# Patient Record
Sex: Male | Born: 1980 | Race: White | Hispanic: No | Marital: Married | Smoking: Current every day smoker
Health system: Southern US, Community
[De-identification: ages and names within clinical notes are randomized; demographics above are authoritative.]

## PROBLEM LIST (undated history)

## (undated) DIAGNOSIS — E7800 Pure hypercholesterolemia, unspecified: Secondary | ICD-10-CM

## (undated) HISTORY — PX: UMBILICAL HERNIA REPAIR: SHX196

---

## 2009-04-24 ENCOUNTER — Emergency Department: Payer: Self-pay | Admitting: Emergency Medicine

## 2014-01-02 ENCOUNTER — Ambulatory Visit: Payer: Self-pay | Admitting: Surgery

## 2014-01-02 LAB — BASIC METABOLIC PANEL
ANION GAP: 3 — AB (ref 7–16)
BUN: 13 mg/dL (ref 7–18)
CHLORIDE: 108 mmol/L — AB (ref 98–107)
Calcium, Total: 9.5 mg/dL (ref 8.5–10.1)
Co2: 28 mmol/L (ref 21–32)
Creatinine: 1 mg/dL (ref 0.60–1.30)
EGFR (African American): >60
EGFR (Non-African Amer.): >60
GLUCOSE: 90 mg/dL (ref 65–99)
Osmolality: 277 (ref 275–301)
POTASSIUM: 4.3 mmol/L (ref 3.5–5.1)
Sodium: 139 mmol/L (ref 136–145)

## 2014-01-07 ENCOUNTER — Ambulatory Visit: Payer: Self-pay | Admitting: Surgery

## 2015-03-15 NOTE — Op Note (Signed)
PATIENT NAME:  Jeremy Cook, Jeremy Cook MR#:  [id number] DATE OF BIRTH:  Jul 16, 1981  DATE OF PROCEDURE:  01/07/2014  PREOPERATIVE DIAGNOSIS: Umbilical hernia.   POSTOPERATIVE DIAGNOSIS: Umbilical hernia.    PROCEDURE PERFORMED: Open primary repair of umbilical hernia.   SURGEON: Shaelin Lalley A. Karon Cotterill, MD  ESTIMATED BLOOD LOSS: 5 mL.   COMPLICATIONS: None.   SPECIMENS: None.   INDICATION FOR SURGERY: Jeremy Cook is a pleasant 34 year old male with history of recurrent belly umbilical pain. He is known to have a small hernia. He is thus brought into the operating room suite for umbilical hernia repair.   DETAILS OF PROCEDURE: As follows: Informed consent was obtained. Jeremy Cook was brought to the operating room suite and laid supine on the operating room table. He was induced. Endotracheal tube was placed, general anesthesia was administered. His abdomen was then prepped and draped in a standard surgical fashion. A timeout was then performed, correctly identifying the patient name, operative report and procedure to be performed. An infraumbilical incision was made and was deepened down to the fascia. The umbilicus was encircled with a Kelly clamp. The umbilicus was then taken off the fascial defect. The defect measured approximately 0.7 x 0.7 cm. A decision was made not to place mesh at this time as this was a fairly small hernia. I then used interrupted 0 Ethibond sutures to close the wound transversely. The wound was then made hemostatic. The umbilicus was then reattached to the fascial repair with an interrupted 3-0 Vicryl. The skin was then closed in 2 layers of 3-0 Vicryl deep dermal and a 4-0 Monocryl subcuticular. Steri-Strips, Telfa gauze and Tegaderm were then placed over the wound. The patient was then awoken, extubated and brought to the postanesthesia care unit. There were no immediate complications. Needle, sponge and instrument count was correct at the end of the procedure.    ____________________________ Si Raiderhristopher A. Zak Gondek, MD cal:lb D: 01/08/2014 15:21:00 ET T: 01/08/2014 15:35:34 ET JOB#: [id number]  cc: Cristal Deerhristopher A. Braylei Totino, MD, <Dictator> Jarvis NewcomerHRISTOPHER A Allan Bacigalupi MD ELECTRONICALLY SIGNED 01/10/2014 9:35

## 2015-05-12 ENCOUNTER — Ambulatory Visit
Admission: EM | Admit: 2015-05-12 | Discharge: 2015-05-12 | Disposition: A | Discharge status: Home / Self Care | Payer: PRIVATE HEALTH INSURANCE | Attending: Family Medicine | Admitting: Family Medicine

## 2015-05-12 DIAGNOSIS — R51 Headache: Secondary | ICD-10-CM | POA: Diagnosis not present

## 2015-05-12 DIAGNOSIS — R519 Headache, unspecified: Secondary | ICD-10-CM

## 2015-05-12 DIAGNOSIS — R1110 Vomiting, unspecified: Secondary | ICD-10-CM

## 2015-05-12 MED ORDER — ONDANSETRON 8 MG PO TBDP
8.0000 mg | ORAL_TABLET | Freq: Once | ORAL | Status: AC
  Administered 2015-05-12: 8 mg via ORAL

## 2015-05-12 NOTE — ED Notes (Signed)
Intermittent headaches several times daily for the last week. This morning at 1000 hrs. Developed "the worst headache of my life". + nausea and vomited x 2. Denies photophobia

## 2015-05-12 NOTE — ED Provider Notes (Signed)
CSN: [id number]     Arrival date & time 05/12/15  1801 History   First MD Initiated Contact with Patient 05/12/15 1820     Chief Complaint  Patient presents with  . Headache   (Consider location/radiation/quality/duration/timing/severity/associated sxs/prior Treatment) HPI Comments: 34 yo male presents with a h/o sudden onset of severe headache all around since 10am. States it's the worst headache he's ever had. Also complains of vomiting x2 since then. Denies photophobia, fevers, chills, vision changes, numbness/tingling, one-sided weakness, injury.   Patient is a 34 y.o. male presenting with headaches. The history is provided by the patient.  Headache Pain location:  Generalized Quality:  Unable to specify Radiates to:  Does not radiate Severity currently:  10/10 Onset quality:  Sudden Duration:  8 hours Timing:  Constant Progression:  Unchanged Chronicity:  New Similar to prior headaches: no   Context: not activity, not exposure to bright light, not caffeine, not coughing, not defecating, not eating, not stress, not exposure to cold air, not intercourse, not loud noise and not straining   Relieved by:  Nothing   History reviewed. No pertinent past medical history. Past Surgical History  Procedure Laterality Date  . Umbilical hernia repair     Family History  Problem Relation Age of Onset  . Hypertension Mother   . Hypertension Father    History  Substance Use Topics  . Smoking status: Current Every Day Smoker -- 1.00 packs/day    Types: Cigarettes  . Smokeless tobacco: Not on file  . Alcohol Use: Yes     Comment: socially    Review of Systems  Neurological: Positive for headaches.    Allergies  Review of patient's allergies indicates no known allergies.  Home Medications   Prior to Admission medications   Medication Sig Start Date End Date Taking? Authorizing Provider  aspirin-acetaminophen-caffeine (EXCEDRIN MIGRAINE) [phone] MG per tablet Take by mouth  every 6 (six) hours as needed for headache.   Yes Historical Provider, MD  ibuprofen (ADVIL,MOTRIN) 800 MG tablet Take 800 mg by mouth every 8 (eight) hours as needed.   Yes Historical Provider, MD   BP 134/96 mmHg  Pulse 90  Temp(Src) 97.4 F (36.3 C) (Tympanic)  Resp 16  Ht  (1.676 m)  Wt 190 lb (86.183 kg)  BMI 30.68 kg/m2  SpO2 99% Physical Exam  Constitutional: He is oriented to person, place, and time. He appears well-developed and well-nourished. No distress.  HENT:  Head: Normocephalic and atraumatic.  Eyes: EOM are normal. Pupils are equal, round, and reactive to light.  Neck: Normal range of motion. Neck supple.  Neurological: He is alert and oriented to person, place, and time. He has normal reflexes. He displays normal reflexes. No cranial nerve deficit. He exhibits normal muscle tone. Coordination normal.  Skin: No rash noted. He is not diaphoretic.  Psychiatric: Thought content normal.    ED Course  Procedures (including critical care time) Labs Review Labs Reviewed - No data to display  Imaging Review No results found.   MDM   1. Acute intractable headache, unspecified headache type   2. Vomiting in adult   (severe headache; acute onset; non-focal neurologic exam)  Plan: 1. diagnosis reviewed with patient; concern with this being severe ("worst headache"), recommend patient go to hospital ED for further evaluation and management; possible CT scan of head. Patient verbalizes understanding and states will proceed by private vehicle with girlfriend driving to hospital ED. Patient in stable condition.  2. Patient given   po zofran in clinic for nausea/vomiting.  Payton Mccallum, MD 05/12/15 1905

## 2015-05-12 NOTE — ED Notes (Signed)
Family at bedside. Dr. Judd Gaudier at bedside to examine patient. Requires further evaluation. [facility] ER notified that wife taking patient POV.

## 2016-02-05 ENCOUNTER — Encounter: Payer: Self-pay | Admitting: *Deleted

## 2016-02-05 ENCOUNTER — Ambulatory Visit
Admission: EM | Admit: 2016-02-05 | Discharge: 2016-02-05 | Disposition: A | Discharge status: Home / Self Care | Payer: PRIVATE HEALTH INSURANCE | Attending: Family Medicine | Admitting: Family Medicine

## 2016-02-05 DIAGNOSIS — N410 Acute prostatitis: Secondary | ICD-10-CM

## 2016-02-05 DIAGNOSIS — J111 Influenza due to unidentified influenza virus with other respiratory manifestations: Secondary | ICD-10-CM | POA: Diagnosis not present

## 2016-02-05 LAB — CHLAMYDIA/NGC RT PCR (ARMC ONLY)
CHLAMYDIA TR: NOT DETECTED
N gonorrhoeae: NOT DETECTED

## 2016-02-05 LAB — URINALYSIS COMPLETE WITH MICROSCOPIC (ARMC ONLY)
BILIRUBIN URINE: NEGATIVE
Glucose, UA: NEGATIVE mg/dL
Ketones, ur: NEGATIVE mg/dL
NITRITE: POSITIVE — AB
PH: 6 (ref 5.0–8.0)
Protein, ur: 30 mg/dL — AB
Specific Gravity, Urine: 1.025 (ref 1.005–1.030)
Squamous Epithelial / LPF: NONE SEEN

## 2016-02-05 LAB — OCCULT BLOOD X 1 CARD TO LAB, STOOL: Fecal Occult Bld: NEGATIVE

## 2016-02-05 LAB — RAPID INFLUENZA A&B ANTIGENS
Influenza A (ARMC): NEGATIVE
Influenza B (ARMC): NEGATIVE

## 2016-02-05 MED ORDER — CIPROFLOXACIN HCL 500 MG PO TABS: 500.0000 mg | ORAL_TABLET | Freq: Two times a day (BID) | ORAL | Status: DC

## 2016-02-05 MED ORDER — ACETAMINOPHEN 500 MG PO TABS
1000.0000 mg | ORAL_TABLET | Freq: Once | ORAL | Status: AC
  Administered 2016-02-05: 1000 mg via ORAL

## 2016-02-05 MED ORDER — ONDANSETRON 8 MG PO TBDP
8.0000 mg | ORAL_TABLET | Freq: Once | ORAL | Status: AC
  Administered 2016-02-05: 8 mg via ORAL

## 2016-02-05 MED ORDER — OSELTAMIVIR PHOSPHATE 75 MG PO CAPS: 75.0000 mg | ORAL_CAPSULE | Freq: Two times a day (BID) | ORAL | Status: DC

## 2016-02-05 NOTE — Discharge Instructions (Signed)

## 2016-02-05 NOTE — ED Provider Notes (Signed)
CSN: [id number]     Arrival date & time 02/05/16  1341 History   First MD Initiated Contact with Patient 02/05/16 1531     Chief Complaint  Patient presents with  . Fever  . Generalized Body Aches  . Abdominal Pain  . Nausea  . Dysuria   (Consider location/radiation/quality/duration/timing/severity/associated sxs/prior Treatment) HPI   This a 35 year old male who presents with general body aches fever nausea dysuria and now with dry heaves and some constipation. He states this was a sudden onset yesterday it seemed worse today B. His main complaint is that of generalized body aches. He states that at times it is difficult to initiate urination and will on occasion be uncomfortable but is certainly not an ongoing instant problem. He did not have a flu shot this year vital signs include a temperature 99.5 pulse 63 respirations 16 blood pressure 129/80 room air sat is 100%        History reviewed. No pertinent past medical history. Past Surgical History  Procedure Laterality Date  . Umbilical hernia repair     Family History  Problem Relation Age of Onset  . Hypertension Mother   . Hypertension Father    Social History  Substance Use Topics  . Smoking status: Current Every Day Smoker -- 1.00 packs/day    Types: Cigarettes  . Smokeless tobacco: None  . Alcohol Use: Yes     Comment: socially    Review of Systems  Constitutional: Positive for fever, activity change, appetite change and fatigue. Negative for chills.  HENT: Positive for congestion, postnasal drip, rhinorrhea and sinus pressure.   Respiratory: Negative for cough, shortness of breath, wheezing and stridor.   Gastrointestinal: Positive for nausea, vomiting and constipation.  Genitourinary: Positive for difficulty urinating.  All other systems reviewed and are negative.   Allergies  Review of patient's allergies indicates no known allergies.  Home Medications   Prior to Admission medications   Medication  Sig Start Date End Date Taking? Authorizing Provider  acetaminophen (TYLENOL) 325 MG tablet Take 650 mg by mouth every 6 (six) hours as needed.   Yes Historical Provider, MD  ibuprofen (ADVIL,MOTRIN) 800 MG tablet Take 800 mg by mouth every 8 (eight) hours as needed.   Yes Historical Provider, MD  aspirin-acetaminophen-caffeine (EXCEDRIN MIGRAINE) [phone] MG per tablet Take by mouth every 6 (six) hours as needed for headache.    Historical Provider, MD  ciprofloxacin (CIPRO) 500 MG tablet Take 1 tablet (500 mg total) by mouth 2 (two) times daily. 02/05/16   Lutricia Feil, PA-C  oseltamivir (TAMIFLU) 75 MG capsule Take 1 capsule (75 mg total) by mouth every 12 (twelve) hours. 02/05/16   Lutricia Feil, PA-C   Meds Ordered and Administered this Visit   Medications  acetaminophen (TYLENOL) tablet 1,000 mg (1,000 mg Oral Given 02/05/16 1526)  ondansetron (ZOFRAN-ODT) disintegrating tablet 8 mg (8 mg Oral Given 02/05/16 1527)    BP 129/80 mmHg  Pulse 63  Temp(Src) 99.5 F (37.5 C) (Oral)  Resp 16  Ht  (1.676 m)  Wt 190 lb (86.183 kg)  BMI 30.68 kg/m2  SpO2 100% No data found.   Physical Exam  Constitutional: He is oriented to person, place, and time. He appears well-developed and well-nourished. No distress.  HENT:  Head: Normocephalic and atraumatic.  Right Ear: External ear normal.  Left TM is erythematous with retraction  Eyes: Conjunctivae and EOM are normal. Pupils are equal, round, and reactive to light.  Neck: Normal  range of motion. Neck supple.  Pulmonary/Chest: Effort normal and breath sounds normal. No respiratory distress. He has no wheezes. He has no rales.  Genitourinary: No penile tenderness.  Rectal exam was performed he has large external hemorrhoids present. The prostate felt enlarged but not hard is boggy but tender. Is very minimal amount of stool present. Is placed on a Hemoccult card and submitted to the laboratory  Musculoskeletal: Normal range of  motion. He exhibits no edema or tenderness.  Lymphadenopathy:    He has no cervical adenopathy.  Neurological: He is alert and oriented to person, place, and time.  Skin: Skin is warm and dry. He is not diaphoretic.  Psychiatric: He has a normal mood and affect. His behavior is normal. Judgment and thought content normal.  Nursing note and vitals reviewed.   ED Course  Procedures (including critical care time)  Labs Review Labs Reviewed  URINALYSIS COMPLETEWITH MICROSCOPIC ([facility] ONLY) - Abnormal; Notable for the following:    APPearance HAZY (*)    Hgb urine dipstick 1+ (*)    Protein, ur 30 (*)    Nitrite POSITIVE (*)    Leukocytes, UA TRACE (*)    Bacteria, UA MANY (*)    All other components within normal limits  RAPID INFLUENZA A&B ANTIGENS ([facility] ONLY)  CHLAMYDIA/NGC RT PCR ([facility] ONLY)    Imaging Review No results found.   Visual Acuity Review  Right Eye Distance:   Left Eye Distance:   Bilateral Distance:    Right Eye Near:   Left Eye Near:    Bilateral Near:     15:26 Medication Given KF  acetaminophen (TYLENOL) tablet 1,000 mg - Dose: 1,000 mg ; Route: Oral ; Scheduled Time: 1530     15:27 Medication Given KF  ondansetron (ZOFRAN-ODT) disintegrating tablet 8 mg - Dose: 8 mg ; Route: Oral ; Scheduled Time: 1530             MDM   1. Prostatitis, acute   2. Influenza      Discharge Medication List as of 02/05/2016  4:29 PM    START taking these medications   Details  ciprofloxacin (CIPRO) 500 MG tablet Take 1 tablet (500 mg total) by mouth 2 (two) times daily., Starting 02/05/2016, Until Discontinued, Normal    oseltamivir (TAMIFLU) 75 MG capsule Take 1 capsule (75 mg total) by mouth every 12 (twelve) hours., Starting 02/05/2016, Until Discontinued, Normal      Plan: 1. Test/x-ray results and diagnosis reviewed with patient 2. rx as per orders; risks, benefits, potential side effects reviewed with patient 3. Recommend supportive  treatment with Fluids and then nonsteroidal anti-inflammatory medications. I will start him on Cipro 500 mg twice a day for 3 weeks. [facility]. I will also treat him with Tamiflu because of his psoas a body aches and a possible contact with influenza. He is not improving he should follow-up with his primary care physician. 4. F/u prn if symptoms worsen or don't improve   Lutricia FeilWilliam P Trulee Hamstra, PA-C 02/05/16 1643

## 2016-02-05 NOTE — ED Notes (Signed)
gen body aches, fever, nausea, dysuria, onset yesterday, worse today.

## 2020-01-10 ENCOUNTER — Other Ambulatory Visit: Payer: Self-pay

## 2020-01-10 ENCOUNTER — Emergency Department
Admission: EM | Admit: 2020-01-10 | Discharge: 2020-01-10 | Disposition: A | Discharge status: Home / Self Care | Payer: 59 | Attending: Emergency Medicine | Admitting: Emergency Medicine

## 2020-01-10 ENCOUNTER — Emergency Department: Payer: 59

## 2020-01-10 ENCOUNTER — Encounter: Payer: Self-pay | Admitting: Emergency Medicine

## 2020-01-10 DIAGNOSIS — F17210 Nicotine dependence, cigarettes, uncomplicated: Secondary | ICD-10-CM | POA: Diagnosis not present

## 2020-01-10 DIAGNOSIS — S62524A Nondisplaced fracture of distal phalanx of right thumb, initial encounter for closed fracture: Secondary | ICD-10-CM | POA: Diagnosis not present

## 2020-01-10 DIAGNOSIS — W231XXA Caught, crushed, jammed, or pinched between stationary objects, initial encounter: Secondary | ICD-10-CM | POA: Insufficient documentation

## 2020-01-10 DIAGNOSIS — Y9389 Activity, other specified: Secondary | ICD-10-CM | POA: Insufficient documentation

## 2020-01-10 DIAGNOSIS — Y92810 Car as the place of occurrence of the external cause: Secondary | ICD-10-CM | POA: Diagnosis not present

## 2020-01-10 DIAGNOSIS — Y998 Other external cause status: Secondary | ICD-10-CM | POA: Diagnosis not present

## 2020-01-10 DIAGNOSIS — S6991XA Unspecified injury of right wrist, hand and finger(s), initial encounter: Secondary | ICD-10-CM | POA: Diagnosis present

## 2020-01-10 HISTORY — DX: Pure hypercholesterolemia, unspecified: E78.00

## 2020-01-10 MED ORDER — HYDROCODONE-ACETAMINOPHEN 5-325 MG PO TABS: 1.0000 | ORAL_TABLET | Freq: Four times a day (QID) | ORAL | 0 refills | Status: DC | PRN

## 2020-01-10 NOTE — Discharge Instructions (Signed)
Follow-up with Dr. Martha Clan who is the orthopedist on call.  Call the office to make an appointment to make sure that it is healing properly.  Keep the area that is scraped clean and dry.  Watch for any signs of infection.  Wear the splint for your thumb for protection and also for support.  You may take ibuprofen as needed for inflammation and pain also a prescription for pain medication was sent to your pharmacy.  Ice and elevation will also help with your thumb pain.

## 2020-01-10 NOTE — ED Provider Notes (Signed)
[facility] Emergency Department Provider Note   ____________________________________________   First MD Initiated Contact with Patient 01/10/20 [phone]     (approximate)  I have reviewed the triage vital signs and the nursing notes.   HISTORY  Chief Complaint Finger Injury   HPI Jeremy Cook is a 39 y.o. male presents to the ED after he slammed his thumb in his [place] this morning.  Patient states that he felt a snap and has had continued pain in his right thumb.  Patient has had a tetanus booster within the last 3 to 5 years.  He rates his pain as 1 out of 10.       Past Medical History:  Diagnosis Date  . Hypercholesteremia     There are no problems to display for this patient.   Past Surgical History:  Procedure Laterality Date  . UMBILICAL HERNIA REPAIR      Prior to Admission medications   Medication Sig Start Date End Date Taking? Authorizing Provider  acetaminophen (TYLENOL) 325 MG tablet Take 650 mg by mouth every 6 (six) hours as needed.    [provider]  aspirin-acetaminophen-caffeine (EXCEDRIN MIGRAINE) [phone] MG per tablet Take by mouth every 6 (six) hours as needed for headache.    [provider]  HYDROcodone-acetaminophen (NORCO/VICODIN) 5-325 MG tablet Take 1 tablet by mouth every 6 (six) hours as needed for moderate pain. 01/10/20   Tommi Rumps, PA-C    Allergies Patient has no known allergies.  Family History  Problem Relation Age of Onset  . Hypertension Mother   . Hypertension Father     Social History Social History   Tobacco Use  . Smoking status: Current Every Day Smoker    Packs/day: 1.00    Types: Cigarettes  . Smokeless tobacco: Never Used  Substance Use Topics  . Alcohol use: Yes    Comment: socially  . Drug use: Not on file    Review of Systems Constitutional: No fever/chills Cardiovascular: Denies chest pain. Respiratory: Denies shortness of  breath. Musculoskeletal: Right thumb pain. Skin: Abrasion right thumb. Neurological: Negative for focal weakness or numbness. ____________________________________________   PHYSICAL EXAM:  VITAL SIGNS: ED Triage Vitals  Enc Vitals Group     BP 01/10/20 0931 (!) 156/83     Pulse Rate 01/10/20 0931 80     Resp 01/10/20 0931 16     Temp 01/10/20 0931 98 F (36.7 C)     Temp Source 01/10/20 0931 Oral     SpO2 01/10/20 0931 97 %     Weight 01/10/20 0932 210 lb (95.3 kg)     Height 01/10/20 0932 5\' 6"  (1.676 m)     Head Circumference --      Peak Flow --      Pain Score 01/10/20 0932 1     Pain Loc --      Pain Edu? --      Excl. in GC? --     Constitutional: Alert and oriented. Well appearing and in no acute distress. Eyes: Conjunctivae are normal.  Head: Atraumatic. Neck: No stridor.   Cardiovascular: Normal rate, regular rhythm. Grossly normal heart sounds.  Good peripheral circulation. Respiratory: Normal respiratory effort.  No retractions. Lungs CTAB. Musculoskeletal: Examination of the right thumb there is an abrasion to the lateral aspect near the nail but nail does not appear to be involved with his injury.  There is no active bleeding.  No foreign bodies noted.  Is able  to flex and extend the distal portion of his thumb but increases his pain. Neurologic:  Normal speech and language. No gross focal neurologic deficits are appreciated. No gait instability. Skin:  Skin is warm, dry.  Superficial abrasion right thumb. Psychiatric: Mood and affect are normal. Speech and behavior are normal.  ____________________________________________   LABS (all labs ordered are listed, but only abnormal results are displayed)  Labs Reviewed - No data to display  RADIOLOGY   Official radiology report(s): DG Finger Thumb Right  Result Date: 01/10/2020 CLINICAL DATA:  Thumb slammed in car door EXAM: RIGHT THUMB 2+V COMPARISON:  None FINDINGS: Frontal, oblique, and lateral views  were obtained. There is a fracture at the junction of the proximal and mid thirds of the right first distal phalanx. Alignment fracture site near anatomic. No other evident fracture. No dislocation. There is an apparent spur along the dorsal aspect of the proximal most aspect of the first distal phalanx in first IP joint. No appreciable joint space narrowing or erosion. IMPRESSION: Fracture at the junction mid and distal thirds of the first distal phalanx with alignment at fracture site near anatomic. No other fracture. No dislocation. Spurring in the dorsal first IP joint. Electronically Signed   By: Lowella Grip III M.D.   On: 01/10/2020 09:55    ____________________________________________   PROCEDURES  Procedure(s) performed (including Critical Care):  Procedures  Dressing and aluminum finger splint was applied by Halford Decamp, RN.  ____________________________________________   INITIAL IMPRESSION / ASSESSMENT AND PLAN / ED COURSE  As part of my medical decision making, I reviewed the following data within the electronic MEDICAL RECORD NUMBER Notes from prior ED visits and [place] Controlled Substance Database  39 year old male presents to the ED with complaint of injury to right thumb.  Patient states that he shot his thumb in his [place] this morning.  He has continued to have pain in his thumb with movement.  On exam there is a superficial abrasion on the lateral aspect of the nail but without nail injury.  There is moderate tenderness on palpation of the distal portion of his thumb and patient is able to flex and extend but increases the pain.  Patient was made aware that he does have a fracture to the distal phalanx.  He is to follow-up with Dr. Mack Guise and information was given for him to make an appointment.  A metal splint was applied to his thumb that he is to wear for protection until he is able to see the orthopedist.  He will take ibuprofen as needed for mild to moderate pain however  a prescription for Norco was sent to his pharmacy.  He was instructed to ice and elevate to help control the pain and swelling.  He is to return to the emergency department if any severe worsening of his symptoms. ____________________________________________   FINAL CLINICAL IMPRESSION(S) / ED DIAGNOSES  Final diagnoses:  Closed nondisplaced fracture of distal phalanx of right thumb, initial encounter     ED Discharge Orders         Ordered    HYDROcodone-acetaminophen (NORCO/VICODIN) 5-325 MG tablet  Every 6 hours PRN     01/10/20 1025           Note:  This document was prepared using Dragon voice recognition software and may include unintentional dictation errors.    Johnn Hai, PA-C 01/10/20 1100    Duffy Bruce, MD 01/11/20 1140

## 2020-01-10 NOTE — ED Notes (Signed)
See triage note  Presents with injury to right thumb  States he shut it in car door

## 2020-01-10 NOTE — ED Triage Notes (Signed)
Slammed right thumb in car door today.   

## 2020-11-22 IMAGING — CR DG FINGER THUMB 2+V*R*
1 series · 3 of 3 positions shown · non-contrast
Comparison: None

CLINICAL DATA: Thumb slammed in car door

EXAM:
RIGHT THUMB 2+V

[Series 1: dg finger thumb right · 0.14mm/px · 3 of 3 slices shown]
[im 1/3]
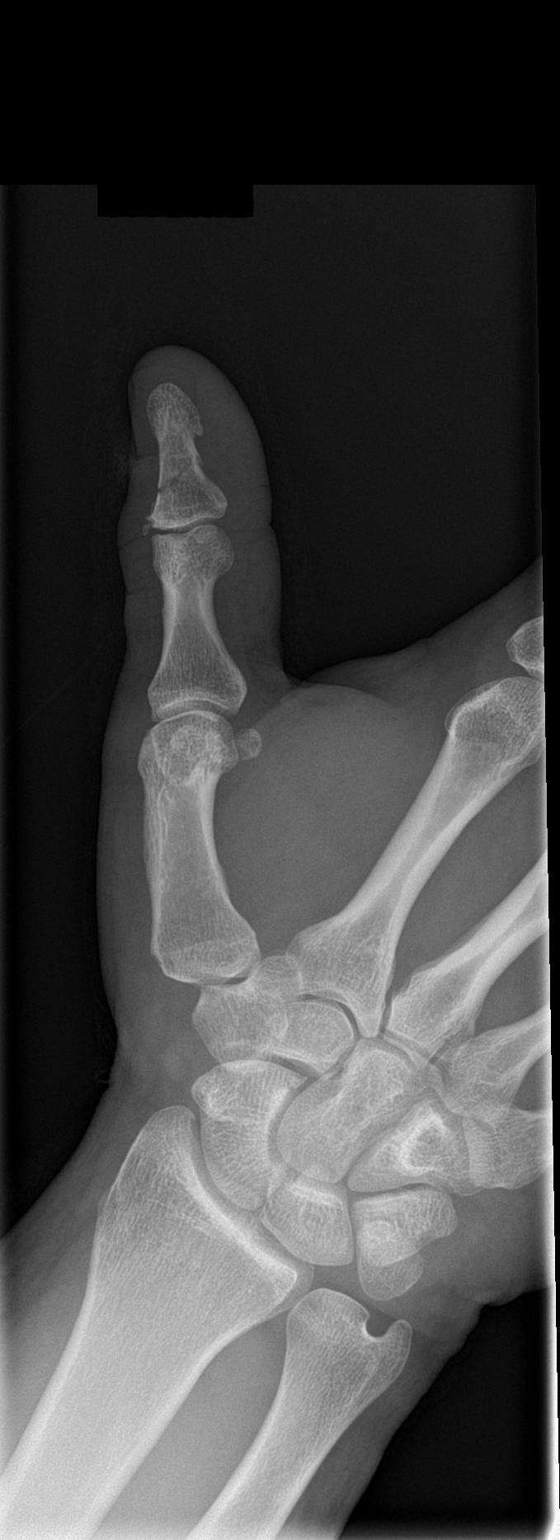
[im 2/3]
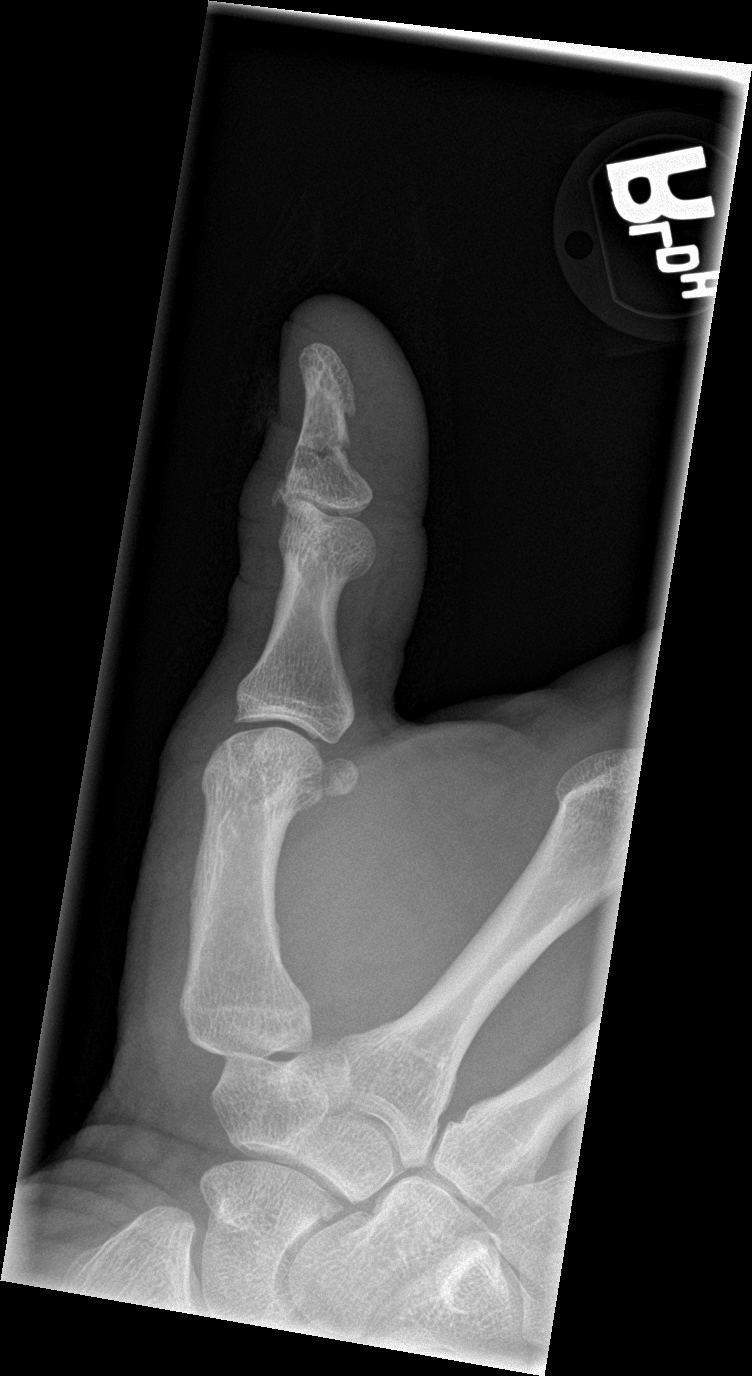
[im 3/3]
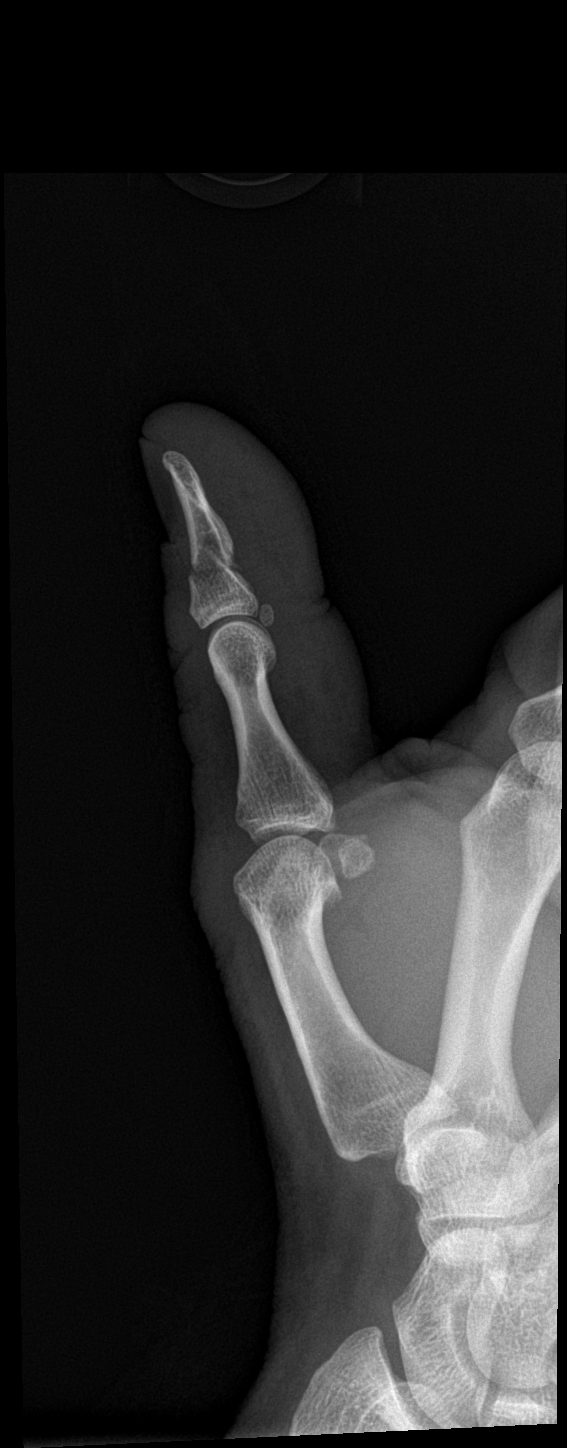

[3 of 3 positions shown; findings below may reference images not displayed]

FINDINGS: Frontal, oblique, and lateral views were obtained. There is a
fracture at the junction of the proximal and mid thirds of the right
first distal phalanx. Alignment fracture site near anatomic. No
other evident fracture. No dislocation. There is an apparent spur
along the dorsal aspect of the proximal most aspect of the first
distal phalanx in first IP joint. No appreciable joint space
narrowing or erosion.
IMPRESSION: Fracture at the junction mid and distal thirds of the first distal
phalanx with alignment at fracture site near anatomic. No other
fracture. No dislocation. Spurring in the dorsal first IP joint.

## 2021-07-01 ENCOUNTER — Other Ambulatory Visit: Payer: Self-pay

## 2021-07-01 ENCOUNTER — Emergency Department
Admission: EM | Admit: 2021-07-01 | Discharge: 2021-07-01 | Disposition: A | Discharge status: Home / Self Care | Payer: 59 | Attending: Emergency Medicine | Admitting: Emergency Medicine

## 2021-07-01 ENCOUNTER — Encounter: Payer: Self-pay | Admitting: Emergency Medicine

## 2021-07-01 DIAGNOSIS — R110 Nausea: Secondary | ICD-10-CM | POA: Insufficient documentation

## 2021-07-01 DIAGNOSIS — R197 Diarrhea, unspecified: Secondary | ICD-10-CM | POA: Diagnosis not present

## 2021-07-01 DIAGNOSIS — Z5321 Procedure and treatment not carried out due to patient leaving prior to being seen by health care provider: Secondary | ICD-10-CM | POA: Diagnosis not present

## 2021-07-01 LAB — COMPREHENSIVE METABOLIC PANEL
ALT: 25 U/L (ref 0–44)
AST: 22 U/L (ref 15–41)
Albumin: 4.6 g/dL (ref 3.5–5.0)
Alkaline Phosphatase: 104 U/L (ref 38–126)
Anion gap: 10 (ref 5–15)
BUN: 21 mg/dL — ABNORMAL HIGH (ref 6–20)
CO2: 24 mmol/L (ref 22–32)
Calcium: 9.9 mg/dL (ref 8.9–10.3)
Chloride: 105 mmol/L (ref 98–111)
Creatinine, Ser: 1.16 mg/dL (ref 0.61–1.24)
GFR, Estimated: >60 mL/min (ref >60)
Glucose, Bld: 145 mg/dL — ABNORMAL HIGH (ref 70–99)
Potassium: 4.1 mmol/L (ref 3.5–5.1)
Sodium: 139 mmol/L (ref 135–145)
Total Bilirubin: 0.9 mg/dL (ref 0.3–1.2)
Total Protein: 8 g/dL (ref 6.5–8.1)

## 2021-07-01 LAB — CBC
HCT: 43.8 % (ref 39.0–52.0)
Hemoglobin: 15.2 g/dL (ref 13.0–17.0)
MCH: 29.3 pg (ref 26.0–34.0)
MCHC: 34.7 g/dL (ref 30.0–36.0)
MCV: 84.6 fL (ref 80.0–100.0)
Platelets: 229 10*3/uL (ref 150–400)
RBC: 5.18 MIL/uL (ref 4.22–5.81)
RDW: 12.8 % (ref 11.5–15.5)
WBC: 8.6 10*3/uL (ref 4.0–10.5)
nRBC: 0 % (ref 0.0–0.2)

## 2021-07-01 LAB — LIPASE, BLOOD: Lipase: 27 U/L (ref 11–51)

## 2021-07-01 MED ORDER — ONDANSETRON 4 MG PO TBDP
4.0000 mg | ORAL_TABLET | Freq: Once | ORAL | Status: AC | PRN
  Administered 2021-07-01: 4 mg via ORAL
  Filled 2021-07-01: qty 1

## 2021-07-01 NOTE — ED Notes (Signed)
Seen leaving. Threw BP cuff and urine cup at nurses station.

## 2021-07-01 NOTE — ED Triage Notes (Signed)
Pt comes into the ED via POV c/o nausea that started at 3:00 am this morning.  Pt states he also had diarrhea yesterday.  Pt denies any specific abd pain, but just explains he is uncomfortable.  Pt in NAD at this time with even and unlabored respirations.

## 2022-06-17 ENCOUNTER — Other Ambulatory Visit: Payer: Self-pay | Admitting: Family Medicine

## 2022-06-17 DIAGNOSIS — N5089 Other specified disorders of the male genital organs: Secondary | ICD-10-CM

## 2022-06-18 ENCOUNTER — Ambulatory Visit
Admission: RE | Admit: 2022-06-18 | Discharge: 2022-06-18 | Disposition: A | Discharge status: Home / Self Care | Payer: BC Managed Care – PPO | Source: Ambulatory Visit | Attending: Family Medicine | Admitting: Family Medicine

## 2022-06-18 DIAGNOSIS — N5089 Other specified disorders of the male genital organs: Secondary | ICD-10-CM | POA: Insufficient documentation

## 2022-06-25 ENCOUNTER — Encounter: Payer: Self-pay | Admitting: *Deleted

## 2024-10-08 ENCOUNTER — Emergency Department
Admission: EM | Admit: 2024-10-08 | Discharge: 2024-10-08 | Disposition: A | Discharge status: Home / Self Care | Attending: Emergency Medicine | Admitting: Emergency Medicine

## 2024-10-08 ENCOUNTER — Other Ambulatory Visit: Payer: Self-pay

## 2024-10-08 ENCOUNTER — Emergency Department

## 2024-10-08 DIAGNOSIS — R109 Unspecified abdominal pain: Secondary | ICD-10-CM | POA: Diagnosis present

## 2024-10-08 DIAGNOSIS — K529 Noninfective gastroenteritis and colitis, unspecified: Secondary | ICD-10-CM | POA: Insufficient documentation

## 2024-10-08 LAB — URINALYSIS, ROUTINE W REFLEX MICROSCOPIC
Bilirubin Urine: NEGATIVE
Glucose, UA: NEGATIVE mg/dL
Hgb urine dipstick: NEGATIVE
Ketones, ur: NEGATIVE mg/dL
Leukocytes,Ua: NEGATIVE
Nitrite: NEGATIVE
Protein, ur: 30 mg/dL — AB
Specific Gravity, Urine: 1.025 (ref 1.005–1.030)
pH: 5 (ref 5.0–8.0)

## 2024-10-08 LAB — COMPREHENSIVE METABOLIC PANEL WITH GFR
ALT: 17 U/L (ref 0–44)
AST: 13 U/L — ABNORMAL LOW (ref 15–41)
Albumin: 4.8 g/dL (ref 3.5–5.0)
Alkaline Phosphatase: 112 U/L (ref 38–126)
Anion gap: 16 — ABNORMAL HIGH (ref 5–15)
BUN: 13 mg/dL (ref 6–20)
CO2: 21 mmol/L — ABNORMAL LOW (ref 22–32)
Calcium: 10.2 mg/dL (ref 8.9–10.3)
Chloride: 103 mmol/L (ref 98–111)
Creatinine, Ser: 1.05 mg/dL (ref 0.61–1.24)
GFR, Estimated: >60 mL/min (ref >60)
Glucose, Bld: 113 mg/dL — ABNORMAL HIGH (ref 70–99)
Potassium: 4.1 mmol/L (ref 3.5–5.1)
Sodium: 139 mmol/L (ref 135–145)
Total Bilirubin: 0.6 mg/dL (ref 0.0–1.2)
Total Protein: 7.7 g/dL (ref 6.5–8.1)

## 2024-10-08 LAB — CBC
HCT: 46.3 % (ref 39.0–52.0)
Hemoglobin: 16.2 g/dL (ref 13.0–17.0)
MCH: 29.2 pg (ref 26.0–34.0)
MCHC: 35 g/dL (ref 30.0–36.0)
MCV: 83.4 fL (ref 80.0–100.0)
Platelets: 321 K/uL (ref 150–400)
RBC: 5.55 MIL/uL (ref 4.22–5.81)
RDW: 12.5 % (ref 11.5–15.5)
WBC: 11 K/uL — ABNORMAL HIGH (ref 4.0–10.5)
nRBC: 0 % (ref 0.0–0.2)

## 2024-10-08 LAB — LIPASE, BLOOD: Lipase: 21 U/L (ref 11–51)

## 2024-10-08 MED ORDER — IOHEXOL 300 MG/ML  SOLN
100.0000 mL | Freq: Once | INTRAMUSCULAR | Status: AC | PRN
  Administered 2024-10-08: 100 mL via INTRAVENOUS

## 2024-10-08 MED ORDER — SODIUM CHLORIDE 0.9 % IV BOLUS
1000.0000 mL | Freq: Once | INTRAVENOUS | Status: AC
  Administered 2024-10-08: 1000 mL via INTRAVENOUS

## 2024-10-08 MED ORDER — ONDANSETRON HCL 4 MG/2ML IJ SOLN
4.0000 mg | Freq: Once | INTRAMUSCULAR | Status: AC
  Administered 2024-10-08: 4 mg via INTRAVENOUS
  Filled 2024-10-08: qty 2

## 2024-10-08 MED ORDER — ONDANSETRON 4 MG PO TBDP: 4.0000 mg | ORAL_TABLET | Freq: Three times a day (TID) | ORAL | 0 refills | Status: AC | PRN

## 2024-10-08 MED ORDER — MORPHINE SULFATE (PF) 4 MG/ML IV SOLN
4.0000 mg | Freq: Once | INTRAVENOUS | Status: AC
  Administered 2024-10-08: 4 mg via INTRAVENOUS
  Filled 2024-10-08: qty 1

## 2024-10-08 NOTE — ED Provider Notes (Signed)
 [facility] Provider Note    Event Date/Time   First MD Initiated Contact with Patient 10/08/24 2024     (approximate)  History   Chief Complaint: Abdominal Pain  HPI  Jeremy Cook is a 43 y.o. male with a past medical history of hyperlipidemia who presents to the emergency department for abdominal pain.  According to the patient since yesterday he has been experiencing mid abdominal discomfort along with nausea vomiting diarrhea.  Patient states the pain had increased to 10 out of 10 today so he came to the emergency department for evaluation.  Patient denies any fever.  No urinary symptoms.  States the pain seems to move around in location but involves most of the abdomen.  Physical Exam   Triage Vital Signs: ED Triage Vitals  Encounter Vitals Group     BP 10/08/24 1803 (!) 127/91     Girls Systolic BP Percentile --      Girls Diastolic BP Percentile --      Boys Systolic BP Percentile --      Boys Diastolic BP Percentile --      Pulse Rate 10/08/24 1803 89     Resp 10/08/24 1803 18     Temp 10/08/24 1803 97.6 F (36.4 C)     Temp src --      SpO2 10/08/24 1803 98 %     Weight 10/08/24 1804 180 lb (81.6 kg)     Height 10/08/24 1804 5' 5 (1.651 m)     Head Circumference --      Peak Flow --      Pain Score 10/08/24 1804 10     Pain Loc --      Pain Education --      Exclude from Growth Chart --     Most recent vital signs: Vitals:   10/08/24 1803  BP: (!) 127/91  Pulse: 89  Resp: 18  Temp: 97.6 F (36.4 C)  SpO2: 98%    General: Awake, no distress.  CV:  Good peripheral perfusion.  Regular rate and rhythm  Resp:  Normal effort.  Equal breath sounds bilaterally.  Abd:  No distention.  Soft, mild diffuse tenderness without significant or focal tenderness identified.  No rebound or guarding.  ED Results / Procedures / Treatments   RADIOLOGY  I have reviewed interpret the CT images.  Does appear to have some fluid-filled small  bowel loops possibly indicating more of an enteritis picture.   MEDICATIONS ORDERED IN ED: Medications  morphine (PF) 4 MG/ML injection 4 mg (4 mg Intravenous Given 10/08/24 2040)  ondansetron  (ZOFRAN ) injection 4 mg (4 mg Intravenous Given 10/08/24 2040)  sodium chloride 0.9 % bolus 1,000 mL (1,000 mLs Intravenous New Bag/Given 10/08/24 2044)  iohexol (OMNIPAQUE) 300 MG/ML solution 100 mL (100 mLs Intravenous Contrast Given 10/08/24 2052)     IMPRESSION / MDM / ASSESSMENT AND PLAN / ED COURSE  I reviewed the triage vital signs and the nursing notes.  Patient's presentation is most consistent with acute presentation with potential threat to life or bodily function.  Patient presents emergency department for abdominal discomfort nausea vomiting diarrhea starting yesterday.  Will treat pain nausea IV hydrate.  Lab work shows a reassuring CBC with a white blood cell count of 11, overall reassuring chemistry but does have an anion gap slightly elevated concerning for dehydration.  LFTs and lipase are normal, urinalysis normal.  We will obtain CT imaging to further evaluate.  Patient agreeable  to plan of care.  Patient's workup is reassuring CT scan shows signs of enteritis.  Patient's urinalysis is normal, LFTs and lipase are normal.  Given the patient's reassuring workup and CT consistent with enteritis I believe the patient safe for discharge home with supportive care we will prescribe Zofran  to be used if needed for nausea.  Discussed oral hydration and rest at home as well as good handwashing hygiene.  FINAL CLINICAL IMPRESSION(S) / ED DIAGNOSES   Gastroenteritis   Note:  This document was prepared using Dragon voice recognition software and may include unintentional dictation errors.   Dorothyann Drivers, MD 10/08/24 2224

## 2024-10-08 NOTE — ED Triage Notes (Signed)
 Pt comes in visa pov with complaints of abdominal pain, n/v/d that started yesterday. Pt complains of pain 10/10 at this time. Pt has a history of IBS.

## 2024-10-09 ENCOUNTER — Emergency Department
Admission: EM | Admit: 2024-10-09 | Discharge: 2024-10-09 | Disposition: A | Discharge status: Home / Self Care | Attending: Emergency Medicine | Admitting: Emergency Medicine

## 2024-10-09 ENCOUNTER — Other Ambulatory Visit: Payer: Self-pay

## 2024-10-09 ENCOUNTER — Encounter: Payer: Self-pay | Admitting: Emergency Medicine

## 2024-10-09 DIAGNOSIS — A084 Viral intestinal infection, unspecified: Secondary | ICD-10-CM | POA: Insufficient documentation

## 2024-10-09 DIAGNOSIS — R197 Diarrhea, unspecified: Secondary | ICD-10-CM | POA: Diagnosis present

## 2024-10-09 MED ORDER — DICYCLOMINE HCL 20 MG PO TABS: 20.0000 mg | ORAL_TABLET | Freq: Three times a day (TID) | ORAL | 0 refills | Status: AC | PRN

## 2024-10-09 MED ORDER — SODIUM CHLORIDE 0.9 % IV BOLUS (SEPSIS)
1000.0000 mL | Freq: Once | INTRAVENOUS | Status: AC
  Administered 2024-10-09: 1000 mL via INTRAVENOUS

## 2024-10-09 MED ORDER — DICYCLOMINE HCL 10 MG/ML IM SOLN
20.0000 mg | Freq: Once | INTRAMUSCULAR | Status: AC
  Administered 2024-10-09: 20 mg via INTRAMUSCULAR
  Filled 2024-10-09: qty 2

## 2024-10-09 MED ORDER — PANTOPRAZOLE SODIUM 40 MG PO TBEC: 40.0000 mg | DELAYED_RELEASE_TABLET | Freq: Every day | ORAL | 0 refills | Status: DC

## 2024-10-09 MED ORDER — DICYCLOMINE HCL 20 MG PO TABS: 20.0000 mg | ORAL_TABLET | Freq: Three times a day (TID) | ORAL | 0 refills | Status: DC | PRN

## 2024-10-09 MED ORDER — ONDANSETRON HCL 4 MG/2ML IJ SOLN
4.0000 mg | Freq: Once | INTRAMUSCULAR | Status: AC
  Administered 2024-10-09: 4 mg via INTRAVENOUS
  Filled 2024-10-09: qty 2

## 2024-10-09 MED ORDER — KETOROLAC TROMETHAMINE 30 MG/ML IJ SOLN
30.0000 mg | Freq: Once | INTRAMUSCULAR | Status: AC
  Administered 2024-10-09: 30 mg via INTRAVENOUS
  Filled 2024-10-09: qty 1

## 2024-10-09 MED ORDER — PANTOPRAZOLE SODIUM 40 MG IV SOLR
40.0000 mg | Freq: Once | INTRAVENOUS | Status: AC
  Administered 2024-10-09: 40 mg via INTRAVENOUS
  Filled 2024-10-09: qty 10

## 2024-10-09 MED ORDER — PANTOPRAZOLE SODIUM 40 MG PO TBEC: 40.0000 mg | DELAYED_RELEASE_TABLET | Freq: Every day | ORAL | 0 refills | Status: AC

## 2024-10-09 NOTE — ED Notes (Signed)
 PT in no acute distress prior to discharge. Discharged instructions reviewed, all questions answered and pt verbalized understanding at this time. Pt has all belongings with them at time of discharge.

## 2024-10-09 NOTE — ED Provider Notes (Signed)
 [facility] Provider Note    Event Date/Time   First MD Initiated Contact with Patient 10/09/24 [phone]     (approximate)   History   Abdominal Pain   HPI  Jeremy Cook is a 43 y.o. male with history of hyperlipidemia, umbilical hernia repair who presents to the emergency department with abdominal pain, nausea, vomiting and diarrhea.  Was seen here yesterday evening and discharged just a few hours ago with a diagnosis of viral gastroenteritis after CT scan showed enteritis.  Patient reports pain and nausea medicines have worn off.  He is feeling uncomfortable and dry heaving.  States he feels like he needs to throw up but cannot get anything out.  He describes most of the pain in the upper abdomen now.  He reports he is feeling lightheaded.   History provided by patient.    Past Medical History:  Diagnosis Date   Hypercholesteremia     Past Surgical History:  Procedure Laterality Date   UMBILICAL HERNIA REPAIR      MEDICATIONS:  Prior to Admission medications   Medication Sig Start Date End Date Taking? Authorizing Provider  acetaminophen  (TYLENOL ) 325 MG tablet Take 650 mg by mouth every 6 (six) hours as needed.    [provider]  aspirin-acetaminophen -caffeine (EXCEDRIN MIGRAINE) 250-250-65 MG per tablet Take by mouth every 6 (six) hours as needed for headache.    [provider]  HYDROcodone -acetaminophen  (NORCO/VICODIN) 5-325 MG tablet Take 1 tablet by mouth every 6 (six) hours as needed for moderate pain. 01/10/20   Saunders Shona CROME, PA-C  ondansetron  (ZOFRAN -ODT) 4 MG disintegrating tablet Take 1 tablet (4 mg total) by mouth every 8 (eight) hours as needed for nausea or vomiting. 10/08/24   Dorothyann Drivers, MD    Physical Exam   Triage Vital Signs: ED Triage Vitals [10/09/24 0320]  Encounter Vitals Group     BP (!) 151/94     Girls Systolic BP Percentile      Girls Diastolic BP Percentile      Boys Systolic BP  Percentile      Boys Diastolic BP Percentile      Pulse Rate 64     Resp 18     Temp 97.8 F (36.6 C)     Temp src      SpO2 99 %     Weight 179 lb 14.3 oz (81.6 kg)     Height 5' 5 (1.651 m)     Head Circumference      Peak Flow      Pain Score 10     Pain Loc      Pain Education      Exclude from Growth Chart     Most recent vital signs: Vitals:   10/09/24 0320  BP: (!) 151/94  Pulse: 64  Resp: 18  Temp: 97.8 F (36.6 C)  SpO2: 99%    CONSTITUTIONAL: Alert, responds appropriately to questions. Well-appearing; well-nourished HEAD: Normocephalic, atraumatic EYES: Conjunctivae clear, pupils appear equal, sclera nonicteric ENT: normal nose; moist mucous membranes NECK: Supple, normal ROM CARD: RRR; S1 and S2 appreciated RESP: Normal chest excursion without splinting or tachypnea; breath sounds clear and equal bilaterally; no wheezes, no rhonchi, no rales, no hypoxia or respiratory distress, speaking full sentences ABD/GI: Non-distended; soft, tenderness diffusely without guarding or rebound BACK: The back appears normal EXT: Normal ROM in all joints; no deformity noted, no edema SKIN: Normal color for age and race; warm; no rash on  exposed skin NEURO: Moves all extremities equally, normal speech PSYCH: The patient's mood and manner are appropriate.   ED Results / Procedures / Treatments   LABS: (all labs ordered are listed, but only abnormal results are displayed) Labs Reviewed - No data to display   EKG:  EKG Interpretation Date/Time:    Ventricular Rate:    PR Interval:    QRS Duration:    QT Interval:    QTC Calculation:   R Axis:      Text Interpretation:           RADIOLOGY: My personal review and interpretation of imaging: CT scan shows enteritis.  Normal appendix.  I have personally reviewed all radiology reports.   CT ABDOMEN PELVIS W CONTRAST Result Date: 10/08/2024 EXAM: CT ABDOMEN AND PELVIS WITH CONTRAST 10/08/2024 09:00:23 PM  TECHNIQUE: CT of the abdomen and pelvis was performed with the administration of 100 mL of iohexol (OMNIPAQUE) 300 MG/ML solution. Multiplanar reformatted images are provided for review. Automated exposure control, iterative reconstruction, and/or weight-based adjustment of the mA/kV was utilized to reduce the radiation dose to as low as reasonably achievable. COMPARISON: None available. CLINICAL HISTORY: Abdominal pain, acute, nonlocalized. FINDINGS: LOWER CHEST: No acute abnormality. LIVER: Moderate hepatic steatosis. No intrahepatic mass. No intrahepatic biliary ductal dilation. GALLBLADDER AND BILE DUCTS: Gallbladder is unremarkable. No biliary ductal dilatation. SPLEEN: No acute abnormality. PANCREAS: No acute abnormality. ADRENAL GLANDS: No acute abnormality. KIDNEYS, URETERS AND BLADDER: No stones in the kidneys or ureters. No hydronephrosis. No perinephric or periureteral stranding. Urinary bladder is unremarkable. GI AND BOWEL: Stomach demonstrates no acute abnormality. There is long segment fluid-filled hyperemic small bowel involving the distal jejunum and entire terminal ileum with prominent circumferential bowel wall thickening and submucosal edema involving the terminal ileum in keeping with an infectious or inflammatory enteritis. No evidence of obstruction or perforation. The stomach, small bowel, and large bowel are otherwise unremarkable. Appendix normal. PERITONEUM AND RETROPERITONEUM: Trace ascites. No free air. VASCULATURE: Aorta is normal in caliber. Mild aortoiliac atherosclerotic calcification. No aortic aneurysm. LYMPH NODES: No lymphadenopathy. REPRODUCTIVE ORGANS: Mild prostatic hypertrophy. BONES AND SOFT TISSUES: No acute osseous abnormality. No focal soft tissue abnormality. IMPRESSION: 1. Long segment fluid-filled hyperemic small bowel involving the distal jejunum and terminal ileum with prominent circumferential wall thickening and submucosal edema of the terminal ileum, consistent  with infectious or inflammatory enteritis; trace ascites; no evidence of obstruction or perforation 2. Moderate hepatic steatosis 3. Mild prostatic hypertrophy Electronically signed by: Dorethia Molt MD 10/08/2024 09:56 PM EST RP Workstation: [id number]     PROCEDURES:  Critical Care performed: No      Procedures    IMPRESSION / MDM / ASSESSMENT AND PLAN / ED COURSE  I reviewed the triage vital signs and the nursing notes.    Patient here with return of pain, dry heaving after recent evaluation and treatment for gastroenteritis in the emergency department.    DIFFERENTIAL DIAGNOSIS (includes but not limited to):   Intractable symptoms, dehydration, viral gastroenteritis, doubt appendicitis, bowel obstruction   Patient's presentation is most consistent with acute complicated illness / injury requiring diagnostic workup.   PLAN: Will place IV and give additional IV pain and nausea medicine, IV fluids.  No indication for repeat labs, imaging given this was just done less than 12 hours ago.  Normal appendix seen on CT scan earlier today.  CT showed enteritis suggestive of a viral gastroenteritis.   MEDICATIONS GIVEN IN ED: Medications  sodium chloride 0.9 % bolus  1,000 mL (1,000 mLs Intravenous New Bag/Given 10/09/24 0355)  ondansetron  (ZOFRAN ) injection 4 mg (4 mg Intravenous Given 10/09/24 0355)  dicyclomine (BENTYL) injection 20 mg (20 mg Intramuscular Given 10/09/24 0356)  ketorolac (TORADOL) 30 MG/ML injection 30 mg (30 mg Intravenous Given 10/09/24 0355)  pantoprazole (PROTONIX) injection 40 mg (40 mg Intravenous Given 10/09/24 0354)     ED COURSE:  4:28 AM  Pt reports feeling much better.  Now tolerating p.o.  Has already been discharged with prescription of Zofran .  Will provide prescriptions for Bentyl, Protonix and recommended a bland diet for the next several days.  Patient and wife are comfortable with this plan.  We also discussed that he can alternate Tylenol  and  ibuprofen over-the-counter as needed.  At this time, I do not feel there is any life-threatening condition present. I reviewed all nursing notes, vitals, pertinent previous records.  All lab and urine results, EKGs, imaging ordered have been independently reviewed and interpreted by myself.  I reviewed all available radiology reports from any imaging ordered this visit.  Based on my assessment, I feel the patient is safe to be discharged home without further emergent workup and can continue workup as an outpatient as needed. Discussed all findings, treatment plan as well as usual and customary return precautions.  They verbalize understanding and are comfortable with this plan.  Outpatient follow-up has been provided as needed.  All questions have been answered.    CONSULTS:  none   OUTSIDE RECORDS REVIEWED: Reviewed last GI note on 08/23/2024.       FINAL CLINICAL IMPRESSION(S) / ED DIAGNOSES   Final diagnoses:  Viral gastroenteritis     Rx / DC Orders   ED Discharge Orders          Ordered    dicyclomine (BENTYL) 20 MG tablet  Every 8 hours PRN        10/09/24 0429    pantoprazole (PROTONIX) 40 MG tablet  Daily        10/09/24 0429             Note:  This document was prepared using Dragon voice recognition software and may include unintentional dictation errors.   Marcus Schwandt, Josette SAILOR, DO 10/09/24 0430

## 2024-10-09 NOTE — ED Triage Notes (Signed)
 Patient ambulatory to triage with steady gait, without difficulty or distress noted; pt reports being d/c at 1030pm; st dx with intestinal infection, received and injection and was fine but pain returned; reports upper abd pain accomp by dry heaving

## 2024-10-09 NOTE — Discharge Instructions (Addendum)
 You may alternate over the counter Tylenol  1000 mg every 6 hours as needed for pain, fever and Ibuprofen 800 mg every 6-8 hours as needed for pain, fever.  Please take Ibuprofen with food.  Do not take more than 4000 mg of Tylenol  (acetaminophen ) in a 24 hour period.  You may take over-the-counter Imodium as needed for diarrhea.  You may take Zofran  that was sent to your pharmacy as needed for nausea and vomiting.  We also sent a prescription for Bentyl which you may take for abdominal cramping, abdominal pain.  You may also take over-the-counter Mylanta or Maalox as needed for upper abdominal pain, heartburn and we have also provided you with a prescription for Protonix to take daily as needed.  I recommend a bland diet for the next several days and increase fluid intake.
